# Patient Record
Sex: Female | Born: 1995 | Race: White | Hispanic: Yes | Marital: Single | Smoking: Never smoker
Health system: Southern US, Community
[De-identification: ages and names within clinical notes are randomized; demographics above are authoritative.]

## PROBLEM LIST (undated history)

## (undated) DIAGNOSIS — D649 Anemia, unspecified: Secondary | ICD-10-CM

---

## 2013-06-07 ENCOUNTER — Emergency Department (HOSPITAL_COMMUNITY)
Admission: EM | Admit: 2013-06-07 | Discharge: 2013-06-08 | Disposition: A | Discharge status: Home / Self Care | Payer: Self-pay | Attending: Emergency Medicine | Admitting: Emergency Medicine

## 2013-06-07 ENCOUNTER — Encounter (HOSPITAL_COMMUNITY): Payer: Self-pay | Admitting: *Deleted

## 2013-06-07 DIAGNOSIS — W268XXA Contact with other sharp object(s), not elsewhere classified, initial encounter: Secondary | ICD-10-CM | POA: Insufficient documentation

## 2013-06-07 DIAGNOSIS — S61411A Laceration without foreign body of right hand, initial encounter: Secondary | ICD-10-CM

## 2013-06-07 DIAGNOSIS — S61209A Unspecified open wound of unspecified finger without damage to nail, initial encounter: Secondary | ICD-10-CM | POA: Insufficient documentation

## 2013-06-07 DIAGNOSIS — Y92009 Unspecified place in unspecified non-institutional (private) residence as the place of occurrence of the external cause: Secondary | ICD-10-CM | POA: Insufficient documentation

## 2013-06-07 DIAGNOSIS — Y93G1 Activity, food preparation and clean up: Secondary | ICD-10-CM | POA: Insufficient documentation

## 2013-06-07 MED ORDER — IBUPROFEN 800 MG PO TABS
800.0000 mg | ORAL_TABLET | Freq: Once | ORAL | Status: AC
  Administered 2013-06-07: 800 mg via ORAL
  Filled 2013-06-07: qty 1

## 2013-06-07 NOTE — ED Notes (Signed)
Pt c/o laceration right thumb area while washing dishes; bleeding controlled with pressure dressing
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
## 2013-06-11 NOTE — ED Provider Notes (Signed)
Medical screening examination/treatment/procedure(s) were performed by non-physician practitioner and as supervising physician I was immediately available for consultation/collaboration.  Preeya Cleckley R. Kodie Pick, MD 06/11/13 1016 

## 2013-10-11 ENCOUNTER — Ambulatory Visit: Payer: Self-pay | Admitting: Internal Medicine

## 2016-04-23 ENCOUNTER — Encounter: Payer: Self-pay | Admitting: *Deleted

## 2016-04-24 ENCOUNTER — Telehealth: Payer: Self-pay

## 2016-04-24 DIAGNOSIS — N939 Abnormal uterine and vaginal bleeding, unspecified: Secondary | ICD-10-CM

## 2016-04-24 NOTE — Telephone Encounter (Signed)
Patient needs to be scheduled for sonogram prior to appointment schedule for 05/26/2016. I have left a message for patient to call us back regarding u/s

## 2016-04-25 ENCOUNTER — Encounter: Payer: Self-pay | Admitting: *Deleted

## 2016-05-07 ENCOUNTER — Ambulatory Visit (HOSPITAL_COMMUNITY)
Admission: RE | Admit: 2016-05-07 | Discharge: 2016-05-07 | Disposition: A | Discharge status: Home / Self Care | Payer: Self-pay | Source: Ambulatory Visit | Attending: Obstetrics and Gynecology | Admitting: Obstetrics and Gynecology

## 2016-05-07 DIAGNOSIS — N939 Abnormal uterine and vaginal bleeding, unspecified: Secondary | ICD-10-CM | POA: Insufficient documentation

## 2016-05-07 DIAGNOSIS — R938 Abnormal findings on diagnostic imaging of other specified body structures: Secondary | ICD-10-CM | POA: Insufficient documentation

## 2016-05-11 ENCOUNTER — Encounter: Payer: Self-pay | Admitting: Obstetrics and Gynecology

## 2016-05-11 DIAGNOSIS — R9389 Abnormal findings on diagnostic imaging of other specified body structures: Secondary | ICD-10-CM | POA: Insufficient documentation

## 2016-05-27 ENCOUNTER — Encounter: Payer: Self-pay | Admitting: Obstetrics and Gynecology

## 2016-05-27 NOTE — Progress Notes (Signed)
Patient did not keep GYN referral appointment for 05/27/2016.  Antanisha Mohs, Jr MD [facility])   

## 2016-06-28 ENCOUNTER — Telehealth: Payer: Self-pay

## 2016-06-28 NOTE — Telephone Encounter (Signed)
Pt received a call and is not sure why. Please advise at [phone]

## 2016-07-01 NOTE — Telephone Encounter (Signed)
LMVM for patient stating that there is no documentation in her chart that someone tried to call her.  I apologized for any inconvenience this may have caused the patient and told her if she needed anything further she could call us back.

## 2018-03-18 IMAGING — US US PELVIS COMPLETE
1 series · 15 of 25 positions shown · non-contrast
Comparison: None

CLINICAL DATA: Abnormal uterine bleeding.

EXAM:
TRANSABDOMINAL AND TRANSVAGINAL ULTRASOUND OF PELVIS
TECHNIQUE: Both transabdominal and transvaginal ultrasound examinations of the
pelvis were performed. Transabdominal technique was performed for
global imaging of the pelvis including uterus, ovaries, adnexal
regions, and pelvic cul-de-sac. It was necessary to proceed with
endovaginal exam following the transabdominal exam to visualize the
endometrium and ovaries.

[Series 1: us pelvis complete · 15 of 104 slices shown]
[im 1/104]
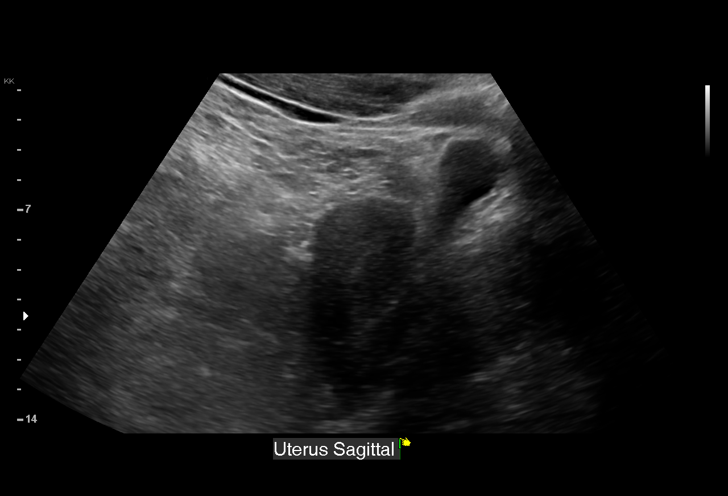
[im 9/104]
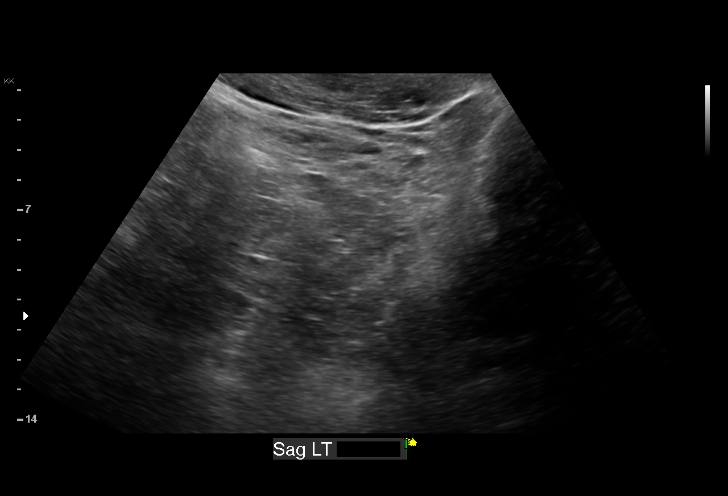
[im 18/104]
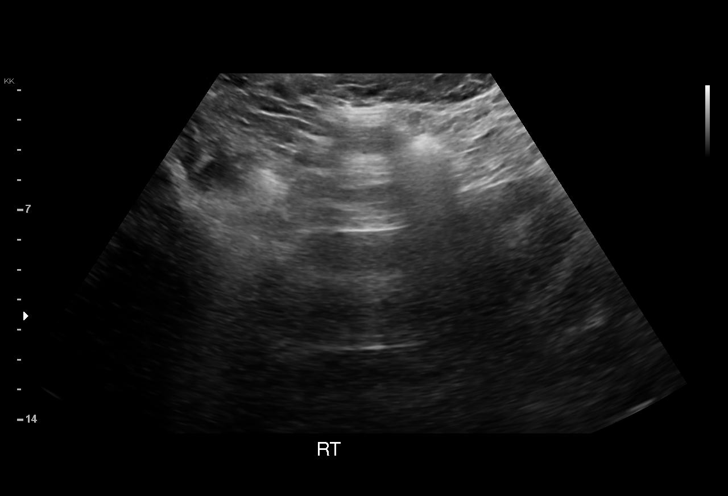
[im 22/104]
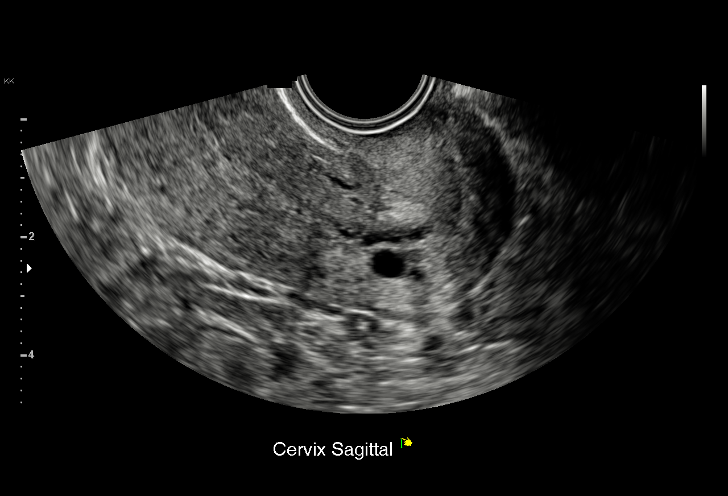
[im 31/104]
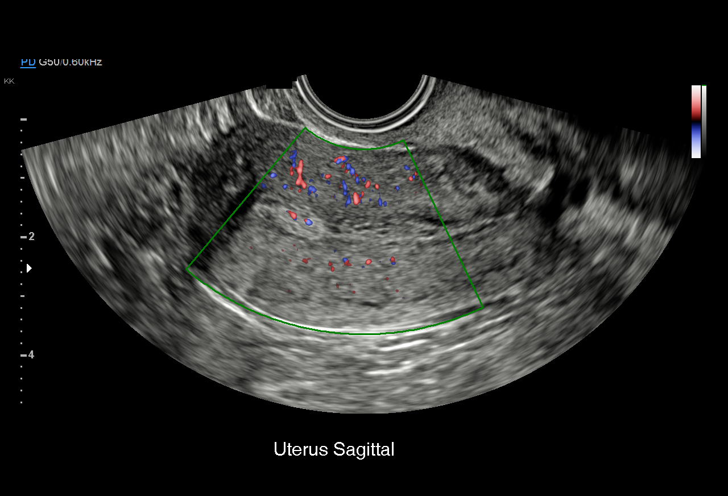
[im 39/104]
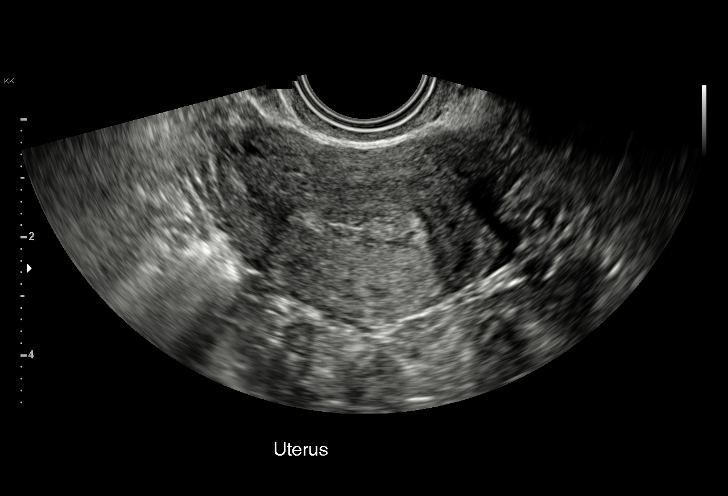
[im 43/104]
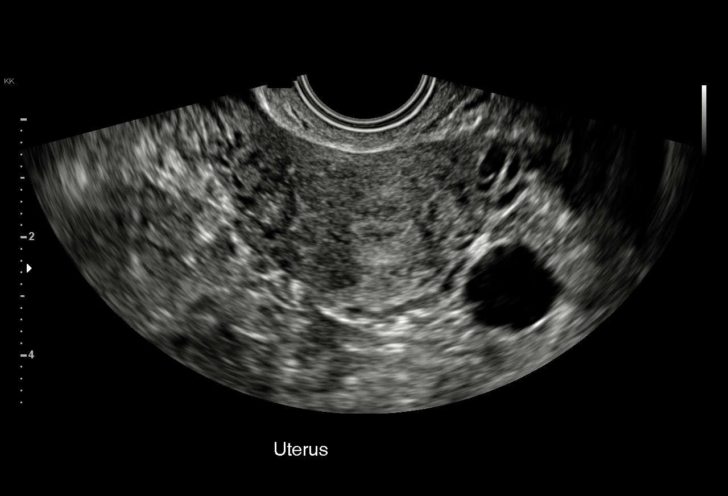
[im 52/104]
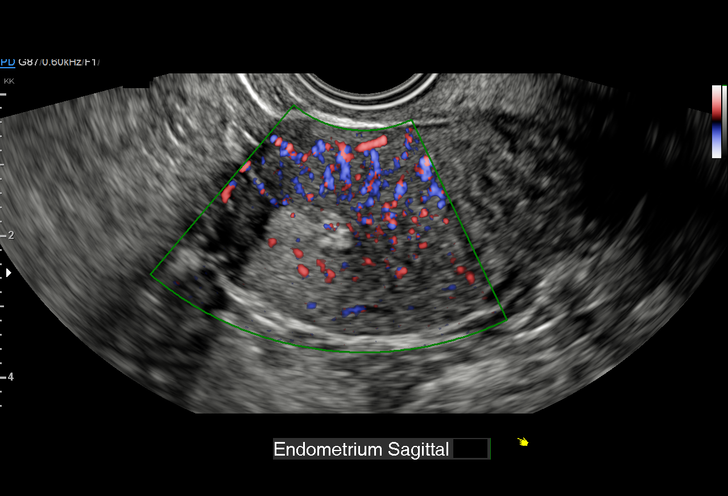
[im 61/104]
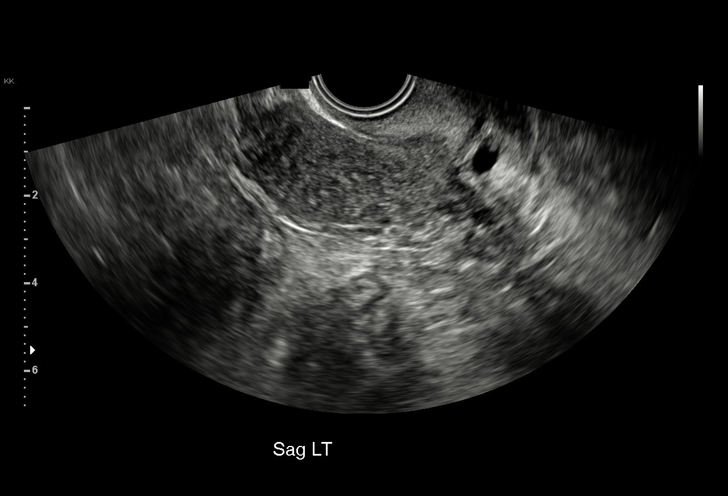
[im 65/104]
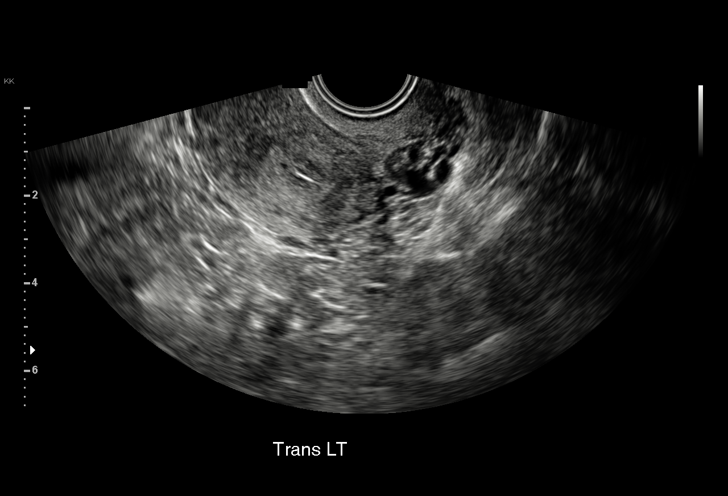
[im 73/104]
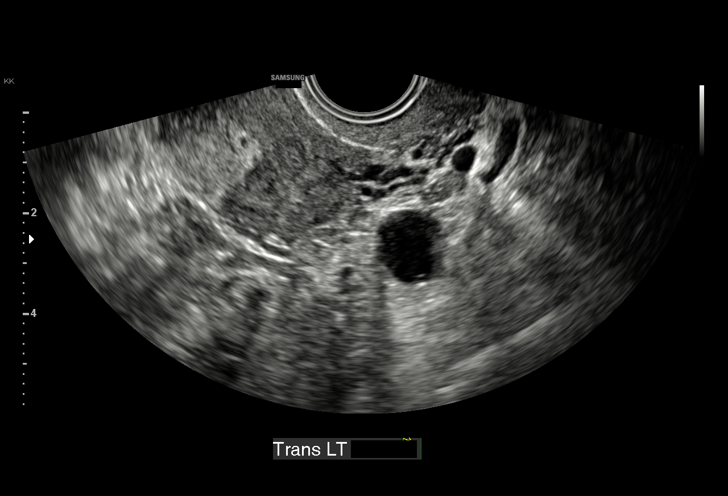
[im 82/104]
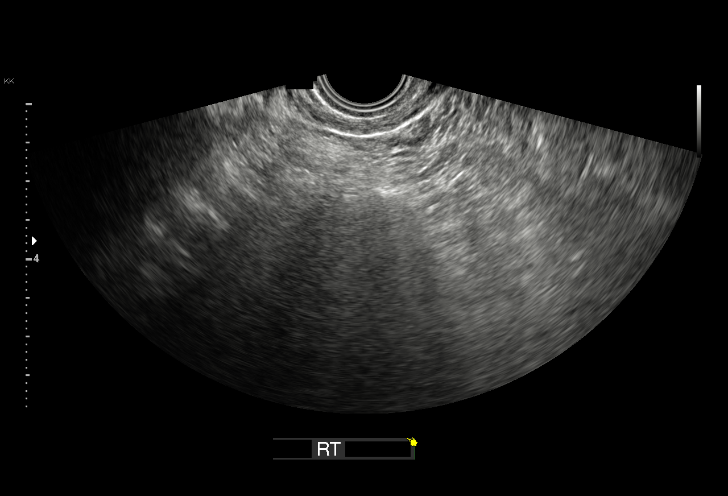
[im 86/104]
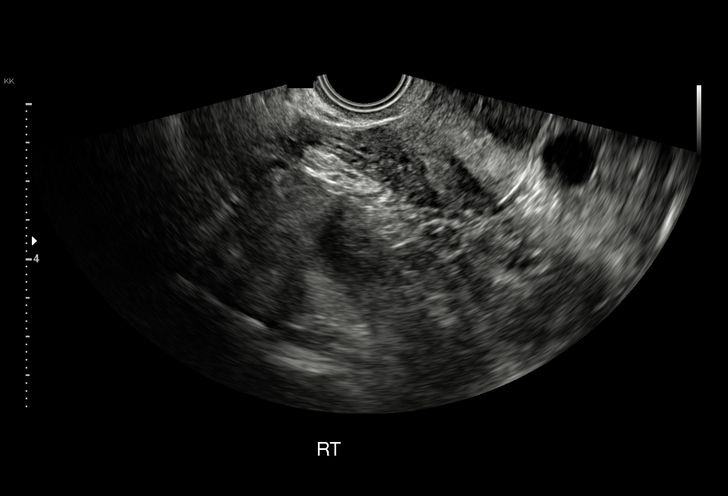
[im 95/104]
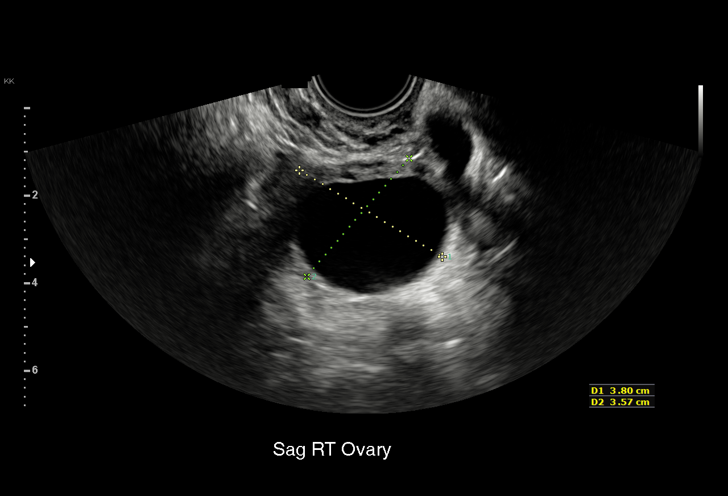
[im 104/104]
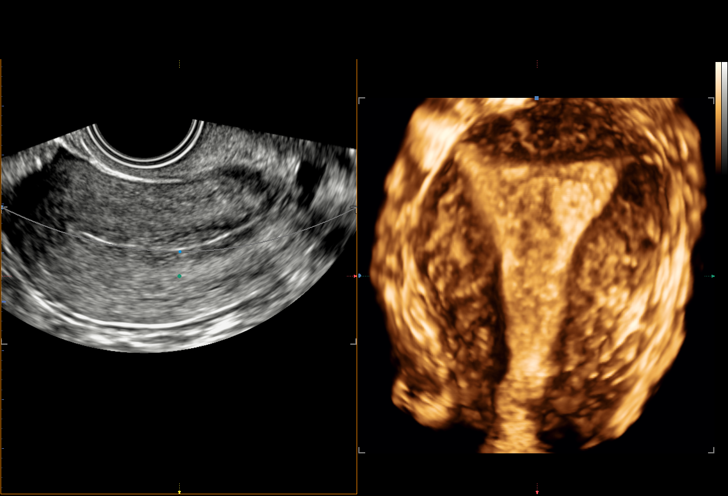

[15 of 25 positions shown; findings below may reference images not displayed]

FINDINGS: Uterus

Measurements: 8.1 x 5.0 x 3.1 cm. No fibroids or other mass
visualized.

Endometrium

Thickness: 5 mm. Focal echogenic mass with blood flow is seen in the
left side of the fundus which measures 1.1 x 1.0 x 0.4 cm.

Right ovary

Measurements: 3.8 x 3.6 x 3.2 cm. 3.4 cm simple follicular cyst is
noted.

Left ovary

Measurements: 2.6 x 1.3 x 1.1 cm. Normal appearance/no adnexal mass.

Other findings

No abnormal free fluid.
IMPRESSION: 1.1 cm focal echogenic abnormality seen within the endometrium in
the left side of the fundus which demonstrates vascular flow on
Doppler. Consider further evaluation with sonohysterogram for
confirmation prior to hysteroscopy. Endometrial sampling should also
be considered if patient is at high risk for endometrial carcinoma.
(Ref: Radiological Reasoning: Algorithmic Workup of Abnormal Vaginal
Bleeding with Endovaginal Sonography and Sonohysterography. AJR
0444; 191:S68-73)

## 2018-05-26 ENCOUNTER — Other Ambulatory Visit (HOSPITAL_COMMUNITY): Payer: Self-pay | Admitting: *Deleted

## 2018-05-26 DIAGNOSIS — N644 Mastodynia: Secondary | ICD-10-CM

## 2018-06-25 ENCOUNTER — Ambulatory Visit
Admission: RE | Admit: 2018-06-25 | Discharge: 2018-06-25 | Disposition: A | Discharge status: Home / Self Care | Payer: No Typology Code available for payment source | Source: Ambulatory Visit | Attending: Obstetrics and Gynecology | Admitting: Obstetrics and Gynecology

## 2018-06-25 ENCOUNTER — Encounter (HOSPITAL_COMMUNITY): Payer: Self-pay

## 2018-06-25 ENCOUNTER — Ambulatory Visit
Admission: RE | Admit: 2018-06-25 | Discharge: 2018-06-25 | Disposition: A | Discharge status: Home / Self Care | Payer: Self-pay | Source: Ambulatory Visit | Attending: Obstetrics and Gynecology | Admitting: Obstetrics and Gynecology

## 2018-06-25 ENCOUNTER — Ambulatory Visit (HOSPITAL_COMMUNITY)
Admission: RE | Admit: 2018-06-25 | Discharge: 2018-06-25 | Disposition: A | Discharge status: Home / Self Care | Payer: Self-pay | Source: Ambulatory Visit | Attending: Obstetrics and Gynecology | Admitting: Obstetrics and Gynecology

## 2018-06-25 VITALS — BP 118/78 | Ht 64.0 in | Wt 194.0 lb

## 2018-06-25 DIAGNOSIS — N644 Mastodynia: Secondary | ICD-10-CM

## 2018-06-25 DIAGNOSIS — Z01419 Encounter for gynecological examination (general) (routine) without abnormal findings: Secondary | ICD-10-CM

## 2018-06-25 HISTORY — DX: Anemia, unspecified: D64.9

## 2018-06-25 NOTE — Progress Notes (Signed)
Complaints of bilateral breast pain x 2 months that is greater within the right breast. Patient states the pain comes and goes. Patient rates the pain at a 5 out of 10.  Pap Smear: Pap smear completed today. Last Pap smear was 12/12/2015 at [facility] and normal. Patient has a history of an abnormal Pap smear 01/02/2015 that was ASCUS and no HPV typing was completed. Patient had a follow-up Pap smear in one year. Last Pap smear result is in Epic.  Physical exam: Breasts Breasts symmetrical. No skin abnormalities bilateral breasts. No nipple retraction bilateral breasts. No nipple discharge bilateral breasts. No lymphadenopathy. No lumps palpated bilateral breasts. Complaints of right nipple area and outer breast tenderness on exam. Referred patient to the [facility] for a bilateral breast ultrasound. Appointment scheduled for Thursday, June 25, 2018 at 1130.        Pelvic/Bimanual   Ext Genitalia Patients external genitalia was reddened. No lesions, no swelling and no discharge observed on external genitalia.         Vagina Vagina pink and normal texture. No lesions or discharge observed in vagina.          Cervix Cervix is present. Cervix pink and of normal texture. No discharge observed.     Uterus Uterus is present and palpable. Uterus in normal position and normal size.        Adnexae Bilateral ovaries present and palpable. Complaints of left ovary tenderness on palpated. Will refer to the [facility] for follow-up.        Rectovaginal No rectal exam completed today since patient had no rectal complaints. No skin abnormalities observed on exam.    Smoking History: Patient has never smoked.  Patient Navigation: Patient education provided. Access to services provided for patient through [facility] program.   Breast and Cervical Cancer Risk Assessment: Patient has a family history her mother and a sister having breast cancer. Patient has no  known genetic mutations or history of radiation treatment to the chest before age 56. Patient has no history of cervical dysplasia, immunocompromised, or DES exposure in-utero. Breast Cancer risk assessment completed. Unable to calculate breast cancer risk due to patient is under 54 years old.

## 2018-06-25 NOTE — Patient Instructions (Signed)
Explained breast self awareness with Jaynie Collins. Let patient know that if today's Pap smear is normal that her next Pap smear will be due in one year. Referred patient to the [facility] for a bilateral breast ultrasound. Appointment scheduled for Thursday, June 25, 2018 at 1130. Patient aware of appointment and will be there. Let patient know will follow up with her within the next couple weeks with results of Pap smear by letter or phone. Aly Seidenberg verbalized understanding.  Oval Cavazos, Kathaleen Maser, RN 9:01 AM

## 2018-06-25 NOTE — Addendum Note (Signed)
Encounter addended by: Lynnell Dike, LPN on: 11/91/4782 9:30 AM  Actions taken: Order list changed

## 2018-06-29 LAB — CYTOLOGY - PAP
Diagnosis: NEGATIVE
HPV: NOT DETECTED

## 2018-07-03 ENCOUNTER — Encounter (HOSPITAL_COMMUNITY): Payer: Self-pay | Admitting: *Deleted

## 2018-07-27 ENCOUNTER — Encounter (HOSPITAL_COMMUNITY): Payer: Self-pay | Admitting: *Deleted

## 2018-07-27 NOTE — Progress Notes (Signed)
Letter mailed to patient with negative pap smear results. HPV was negative. Next pap smear due in one year due to history of abnormal pap smears.  

## 2018-08-12 ENCOUNTER — Encounter: Payer: No Typology Code available for payment source | Admitting: Obstetrics and Gynecology

## 2018-08-13 ENCOUNTER — Encounter: Payer: No Typology Code available for payment source | Admitting: Obstetrics & Gynecology
